# Patient Record
Sex: Female | Born: 1995 | Race: Black or African American | Hispanic: No | Marital: Single | Smoking: Never smoker
Health system: Southern US, Community
[De-identification: ages and names within clinical notes are randomized; demographics above are authoritative.]

---
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
## 2017-07-09 ENCOUNTER — Other Ambulatory Visit: Payer: Self-pay

## 2017-07-09 ENCOUNTER — Encounter (HOSPITAL_BASED_OUTPATIENT_CLINIC_OR_DEPARTMENT_OTHER): Payer: Self-pay

## 2017-07-09 ENCOUNTER — Emergency Department (HOSPITAL_BASED_OUTPATIENT_CLINIC_OR_DEPARTMENT_OTHER)
Admission: EM | Admit: 2017-07-09 | Discharge: 2017-07-09 | Disposition: A | Discharge status: Home / Self Care | Payer: 59 | Attending: Emergency Medicine | Admitting: Emergency Medicine

## 2017-07-09 DIAGNOSIS — J028 Acute pharyngitis due to other specified organisms: Secondary | ICD-10-CM | POA: Insufficient documentation

## 2017-07-09 DIAGNOSIS — B9689 Other specified bacterial agents as the cause of diseases classified elsewhere: Secondary | ICD-10-CM | POA: Insufficient documentation

## 2017-07-09 DIAGNOSIS — J040 Acute laryngitis: Secondary | ICD-10-CM | POA: Diagnosis not present

## 2017-07-09 DIAGNOSIS — R05 Cough: Secondary | ICD-10-CM | POA: Diagnosis present

## 2017-07-09 LAB — RAPID STREP SCREEN (MED CTR MEBANE ONLY): STREPTOCOCCUS, GROUP A SCREEN (DIRECT): NEGATIVE

## 2017-07-09 NOTE — Discharge Instructions (Signed)
You may take over-the-counter medicine for symptomatic relief, such as Tylenol, Motrin, TheraFlu, Alka seltzer , black elderberry, etc. Please limit acetaminophen (Tylenol) to 4000 mg and Ibuprofen (Motrin, Advil, etc.) to 2400 mg for a 24hr period. Please note that other over-the-counter medicine may contain acetaminophen or ibuprofen as a component of their ingredients.   

## 2017-07-09 NOTE — ED Triage Notes (Signed)
C/o flu like sx x 1 week-NAD-steady gait 

## 2017-07-09 NOTE — ED Provider Notes (Signed)
[facility] EMERGENCY DEPARTMENT Provider Note  CSN: [id number] Arrival date & time: 07/09/17 1053  Chief Complaint(s) Cough  HPI Jenny Ho is a 21 y.o. female who presents to the emergency department with 1 week of runny nose, nasal congestion, productive cough, sore throat.  She reports fevers on day 2 and 3 which have resolved.  Reports that her symptoms have persisted even with the fever cessation.  Denies any alleviating or aggravating factors.  Denies any headache, vision changes, nuchal rigidity, chest pain, shortness of breath, nausea, vomiting, diarrhea.  No abdominal pain.  No other associated symptoms.  Reports history of recurrent strep throat.  HPI  Past Medical History History reviewed. No pertinent past medical history. There are no active problems to display for this patient.  Home Medication(s) Prior to Admission medications   Not on File                                                                                                                                    Past Surgical History History reviewed. No pertinent surgical history. Family History No family history on file.  Social History Social History   Tobacco Use  . Smoking status: Never Smoker  . Smokeless tobacco: Never Used  Substance Use Topics  . Alcohol use: Yes    Frequency: Never    Comment: occ  . Drug use: No   Allergies Mushroom extract complex  Review of Systems Review of Systems All other systems are reviewed and are negative for acute change except as noted in the HPI  Physical Exam Vital Signs  I have reviewed the triage vital signs BP (!) 139/103 (BP Location: Right Arm)   Pulse (!) 104   Temp 99 F (37.2 C) (Oral)   Resp 20   Ht 5' (1.524 m)   Wt (!) 156.5 kg (345 lb)   SpO2 99%   BMI 67.38 kg/m   Physical Exam  Constitutional: She is oriented to person, place, and time. She appears well-developed and well-nourished. No distress.  HENT:  Head:  Normocephalic and atraumatic.  Nose: Rhinorrhea present.  Mouth/Throat: No oropharyngeal exudate, posterior oropharyngeal edema or posterior oropharyngeal erythema. Tonsils are 2+ on the right. Tonsils are 2+ on the left. Tonsillar exudate.  Eyes: Conjunctivae and EOM are normal. Pupils are equal, round, and reactive to light. Right eye exhibits no discharge. Left eye exhibits no discharge. No scleral icterus.  Neck: Normal range of motion. Neck supple.  Cardiovascular: Normal rate and regular rhythm. Exam reveals no gallop and no friction rub.  No murmur heard. Pulmonary/Chest: Effort normal and breath sounds normal. No stridor. No respiratory distress. She has no rales.  Abdominal: Soft. She exhibits no distension. There is no tenderness.  Musculoskeletal: She exhibits no edema or tenderness.  Neurological: She is alert and oriented to person, place, and time.  Skin: Skin is warm and dry. No rash noted. She is  not diaphoretic. No erythema.  Psychiatric: She has a normal mood and affect.  Vitals reviewed.   ED Results and Treatments Labs (all labs ordered are listed, but only abnormal results are displayed) Labs Reviewed  RAPID STREP SCREEN (NOT AT [facility])  CULTURE, GROUP A STREP [facility]                                                                                                                         EKG  EKG Interpretation  Date/Time:    Ventricular Rate:    PR Interval:    QRS Duration:   QT Interval:    QTC Calculation:   R Axis:     Text Interpretation:        Radiology No results found. Pertinent labs & imaging results that were available during my care of the patient were reviewed by me and considered in my medical decision making (see chart for details).  Medications Ordered in ED Medications - No data to display                                                                                                                                   Procedures Procedures  (including critical care time)  Medical Decision Making / ED Course I have reviewed the nursing notes for this encounter and the patient's prior records (if available in EHR or on provided paperwork).    21 y.o. female presents with cough, rhinorrhea, sore throat and fever for 7 days. adequate oral hydration. Rest of history as above.  Patient appears well. No signs of toxicity, patient is interactive and playful. No hypoxia, tachypnea or other signs of respiratory distress. No sign of clinical dehydration.  Patient with evidence of pharyngitis. lung exam clear. Rest of exam as above.  Rapid strep negative.  Most consistent with viral upper respiratory infection.   No evidence suggestive of  AOM, PNA, or meningitis.   Chest x-ray not indicated at this time.  Discussed symptomatic treatment with the patient and they will follow closely with their PCP.    Final Clinical Impression(s) / ED Diagnoses Final diagnoses:  Pharyngitis due to other organism  Laryngitis   Disposition: Discharge  Condition: Good  I have discussed the results, Dx and Tx plan with the patient who expressed understanding and agree(s) with the plan. Discharge instructions discussed at great length. The patient was given strict return precautions who  verbalized understanding of the instructions. No further questions at time of discharge.    ED Discharge Orders    None       Follow Up: Primary care provider  Schedule an appointment as soon as possible for a visit  in 5-7 days, If symptoms do not improve or  worsen      This chart was dictated using voice recognition software.  Despite best efforts to proofread,  errors can occur which can change the documentation meaning.   Nira Connardama, Antha Niday Eduardo, MD 07/09/17 1236

## 2017-07-11 LAB — CULTURE, GROUP A STREP (THRC)

## 2017-07-30 ENCOUNTER — Encounter (HOSPITAL_BASED_OUTPATIENT_CLINIC_OR_DEPARTMENT_OTHER): Payer: Self-pay | Admitting: *Deleted

## 2017-07-30 ENCOUNTER — Emergency Department (HOSPITAL_BASED_OUTPATIENT_CLINIC_OR_DEPARTMENT_OTHER)
Admission: EM | Admit: 2017-07-30 | Discharge: 2017-07-30 | Disposition: A | Discharge status: Home / Self Care | Payer: 59 | Attending: Emergency Medicine | Admitting: Emergency Medicine

## 2017-07-30 ENCOUNTER — Emergency Department (HOSPITAL_BASED_OUTPATIENT_CLINIC_OR_DEPARTMENT_OTHER): Payer: 59

## 2017-07-30 ENCOUNTER — Other Ambulatory Visit: Payer: Self-pay

## 2017-07-30 DIAGNOSIS — J40 Bronchitis, not specified as acute or chronic: Secondary | ICD-10-CM | POA: Insufficient documentation

## 2017-07-30 DIAGNOSIS — R05 Cough: Secondary | ICD-10-CM | POA: Diagnosis present

## 2017-07-30 MED ORDER — BENZONATATE 100 MG PO CAPS: 100.0000 mg | ORAL_CAPSULE | Freq: Three times a day (TID) | ORAL | 0 refills | Status: AC

## 2017-07-30 MED ORDER — ALBUTEROL SULFATE HFA 108 (90 BASE) MCG/ACT IN AERS: 1.0000 | INHALATION_SPRAY | Freq: Four times a day (QID) | RESPIRATORY_TRACT | 0 refills | Status: AC | PRN

## 2017-07-30 NOTE — ED Triage Notes (Signed)
Cough with clear to beige sputum.

## 2017-07-30 NOTE — Discharge Instructions (Signed)
Try albuterol as needed for cough.   Try tessalon pearls as needed as well.   Stay hydrated.   See your doctor  Return to ER if you have worse cough, trouble breathing, sore throat, fever >101.

## 2017-07-30 NOTE — ED Provider Notes (Signed)
[facility] EMERGENCY DEPARTMENT Provider Note   CSN: [id number] Arrival date & time: 07/30/17  1340     History   Chief Complaint Chief Complaint  Patient presents with  . Cough    HPI Jenny Ho is a 21 y.o. female who presented with persistent cough.  Patient was seen in the ED about 3 weeks ago for sore throat and was thought to have laryngitis from viral etiology.  Patient states that since then, her though has improved but she has persistent cough.  She has been trying Delsym as well as herbal remedies and other over-the-counter meds with no relief.  Patient states that the cough gets worse at night and sometimes she has trouble breathing due to the cough.  Denies any fevers or chills currently.  She states that her coworker is sick with similar symptoms.  Denies any leg swelling or recent travel.  Patient quit smoking about a year ago.  The history is provided by the patient.    History reviewed. No pertinent past medical history.  There are no active problems to display for this patient.   History reviewed. No pertinent surgical history.  OB History    No data available       Home Medications    Prior to Admission medications   Not on File    Family History No family history on file.  Social History Social History   Tobacco Use  . Smoking status: Never Smoker  . Smokeless tobacco: Never Used  Substance Use Topics  . Alcohol use: Yes    Frequency: Never    Comment: occ  . Drug use: No     Allergies   Mushroom extract complex   Review of Systems Review of Systems  Respiratory: Positive for cough.   All other systems reviewed and are negative.    Physical Exam Updated Vital Signs BP 111/61 (BP Location: Left Arm)   Pulse (!) 108   Temp 98.1 F (36.7 C) (Oral)   Resp 18   Ht 5' (1.524 m)   Wt (!) 158.8 kg (350 lb)   SpO2 98%   BMI 68.35 kg/m   Physical Exam  Constitutional: She is oriented to person, place, and time.  She appears well-developed.  Coughing   HENT:  Head: Normocephalic.  Mouth/Throat: Oropharynx is clear and moist.  Tonsils enlarged, no exudates, not red   Eyes: Conjunctivae and EOM are normal. Pupils are equal, round, and reactive to light.  Neck: Normal range of motion. Neck supple.  Cardiovascular: Normal rate, regular rhythm and normal heart sounds.  Pulmonary/Chest: Effort normal.  Diminished throughout, no obvious wheezing or crackles   Abdominal: Soft. Bowel sounds are normal. She exhibits no distension. There is no tenderness.  Musculoskeletal: Normal range of motion.  Neurological: She is alert and oriented to person, place, and time. No cranial nerve deficit. Coordination normal.  Skin: Skin is warm.  Psychiatric: She has a normal mood and affect. Her behavior is normal.  Nursing note and vitals reviewed.    ED Treatments / Results  Labs (all labs ordered are listed, but only abnormal results are displayed) Labs Reviewed - No data to display  EKG  EKG Interpretation None       Radiology Dg Chest 2 View  Result Date: 07/30/2017 CLINICAL DATA:  Cough and congestion for 1 month. EXAM: CHEST  2 VIEW COMPARISON:  12/24/2010 chest radiograph FINDINGS: The cardiomediastinal silhouette is unremarkable. There is no evidence of focal airspace disease,  pulmonary edema, suspicious pulmonary nodule/mass, pleural effusion, or pneumothorax. No acute bony abnormalities are identified. IMPRESSION: No active cardiopulmonary disease. Electronically Signed   By: Harmon PierJeffrey  Hu M.D.   On: 07/30/2017 14:07    Procedures Procedures (including critical care time)  Medications Ordered in ED Medications - No data to display   Initial Impression / Assessment and Plan / ED Course  I have reviewed the triage vital signs and the nursing notes.  Pertinent labs & imaging results that were available during my care of the patient were reviewed by me and considered in my medical decision making  (see chart for details).    Jenny Ho is a 21 y.o. female here with cough. Cough for 3 weeks. Likely persistent bronchitis. Diminished breath sounds, no crackles or wheezing. CXR clear. Afebrile. Will give albuterol prn, tessalon pearls for cough. No indication for abx currently.    Final Clinical Impressions(s) / ED Diagnoses   Final diagnoses:  None    ED Discharge Orders    None       Charlynne PanderYao, Rokia Bosket Hsienta, MD 07/30/17 [id number]

## 2018-05-30 ENCOUNTER — Ambulatory Visit: Payer: Self-pay | Admitting: Mental Health

## 2018-06-01 ENCOUNTER — Encounter (HOSPITAL_BASED_OUTPATIENT_CLINIC_OR_DEPARTMENT_OTHER): Payer: Self-pay | Admitting: Emergency Medicine

## 2018-06-01 ENCOUNTER — Other Ambulatory Visit: Payer: Self-pay

## 2018-06-01 ENCOUNTER — Emergency Department (HOSPITAL_BASED_OUTPATIENT_CLINIC_OR_DEPARTMENT_OTHER)
Admission: EM | Admit: 2018-06-01 | Discharge: 2018-06-02 | Disposition: A | Discharge status: Home / Self Care | Payer: Managed Care, Other (non HMO) | Attending: Emergency Medicine | Admitting: Emergency Medicine

## 2018-06-01 DIAGNOSIS — Z79899 Other long term (current) drug therapy: Secondary | ICD-10-CM | POA: Diagnosis not present

## 2018-06-01 DIAGNOSIS — R599 Enlarged lymph nodes, unspecified: Secondary | ICD-10-CM

## 2018-06-01 DIAGNOSIS — R590 Localized enlarged lymph nodes: Secondary | ICD-10-CM | POA: Insufficient documentation

## 2018-06-01 DIAGNOSIS — J029 Acute pharyngitis, unspecified: Secondary | ICD-10-CM | POA: Insufficient documentation

## 2018-06-01 LAB — GROUP A STREP BY PCR: Group A Strep by PCR: NOT DETECTED

## 2018-06-01 NOTE — ED Triage Notes (Signed)
Patient states that she has had bilateral neck and jaw swelling and "knots" since wed

## 2018-06-01 NOTE — ED Provider Notes (Signed)
[facility] EMERGENCY DEPARTMENT Provider Note   CSN: [id number] Arrival date & time: 06/01/18  2228     History   Chief Complaint Chief Complaint  Patient presents with  . Neck Pain    HPI Jenny Ho is a 22 y.o. female who presents with bilateral submandibular swollen lymph nodes.  She states that she recently got back to the area from [place] and was next to several sick kids on the plane.  Afterwards she started to develop swollen lymph nodes and a scratchy throat.  Her grandmother insisted that she come to the emergency department tonight because she was concerned about mumps.  The patient states that she is up-to-date on vaccinations.  She denies fever, ear pain, runny nose, severe sore throat, difficulty swallowing.  HPI  History reviewed. No pertinent past medical history.  There are no active problems to display for this patient.   History reviewed. No pertinent surgical history.   OB History   None      Home Medications    Prior to Admission medications   Medication Sig Start Date End Date Taking? Authorizing Provider  albuterol (PROVENTIL HFA;VENTOLIN HFA) 108 (90 Base) MCG/ACT inhaler Inhale 1-2 puffs into the lungs every 6 (six) hours as needed for wheezing or shortness of breath. 07/30/17   Charlynne Pander, MD  benzonatate (TESSALON) 100 MG capsule Take 1 capsule (100 mg total) by mouth every 8 (eight) hours. 07/30/17   Charlynne Pander, MD    Family History History reviewed. No pertinent family history.  Social History Social History   Tobacco Use  . Smoking status: Never Smoker  . Smokeless tobacco: Never Used  Substance Use Topics  . Alcohol use: Yes    Frequency: Never    Comment: occ  . Drug use: No     Allergies   Mushroom extract complex   Review of Systems Review of Systems  Constitutional: Negative for fever.  HENT: Positive for sore throat.        +swollen lymph nodes     Physical Exam Updated Vital  Signs BP (!) 140/102 (BP Location: Right Arm)   Pulse 95   Temp 98.2 F (36.8 C) (Oral)   Resp 18   Ht 5' (1.524 m)   Wt (!) 156.5 kg   SpO2 100%   BMI 67.38 kg/m   Physical Exam  Constitutional: She is oriented to person, place, and time. She appears well-developed and well-nourished. No distress.  Calm, cooperative, obese  HENT:  Head: Normocephalic and atraumatic.  Mouth/Throat: Uvula is midline and mucous membranes are normal. Posterior oropharyngeal edema present. No oropharyngeal exudate. Tonsils are 2+ on the right. Tonsils are 2+ on the left.  Swollen submandibular glands bilaterally  Eyes: Pupils are equal, round, and reactive to light. Conjunctivae are normal. Right eye exhibits no discharge. Left eye exhibits no discharge. No scleral icterus.  Neck: Normal range of motion.  Cardiovascular: Normal rate.  Pulmonary/Chest: Effort normal. No respiratory distress.  Abdominal: She exhibits no distension.  Neurological: She is alert and oriented to person, place, and time.  Skin: Skin is warm and dry.  Psychiatric: She has a normal mood and affect. Her behavior is normal.  Nursing note and vitals reviewed.    ED Treatments / Results  Labs (all labs ordered are listed, but only abnormal results are displayed) Labs Reviewed  GROUP A STREP BY PCR    EKG None  Radiology No results found.  Procedures Procedures (including critical  care time)  Medications Ordered in ED Medications - No data to display   Initial Impression / Assessment and Plan / ED Course  I have reviewed the triage vital signs and the nursing notes.  Pertinent labs & imaging results that were available during my care of the patient were reviewed by me and considered in my medical decision making (see chart for details).  22 year old female presents with bilateral swollen submandibular lymph nodes.  She is mildly hypertensive but otherwise vital signs are normal.  She is well-appearing on exam  and is not in significant discomfort.  She is handling secretions.  Her tonsils appear swollen on exam.  Strep test is negative. Likely viral. Advised supportive care.  Final Clinical Impressions(s) / ED Diagnoses   Final diagnoses:  Swollen lymph nodes    ED Discharge Orders    None       Bethel Born, PA-C 06/02/18 1406    Geoffery Lyons, MD 06/02/18 2304

## 2018-09-13 IMAGING — CR DG CHEST 2V
2 series · 2 of 2 positions shown · non-contrast
Comparison: 12/24/2010 chest radiograph

CLINICAL DATA: Cough and congestion for 1 month.

EXAM:
CHEST  2 VIEW

[w chest pa]
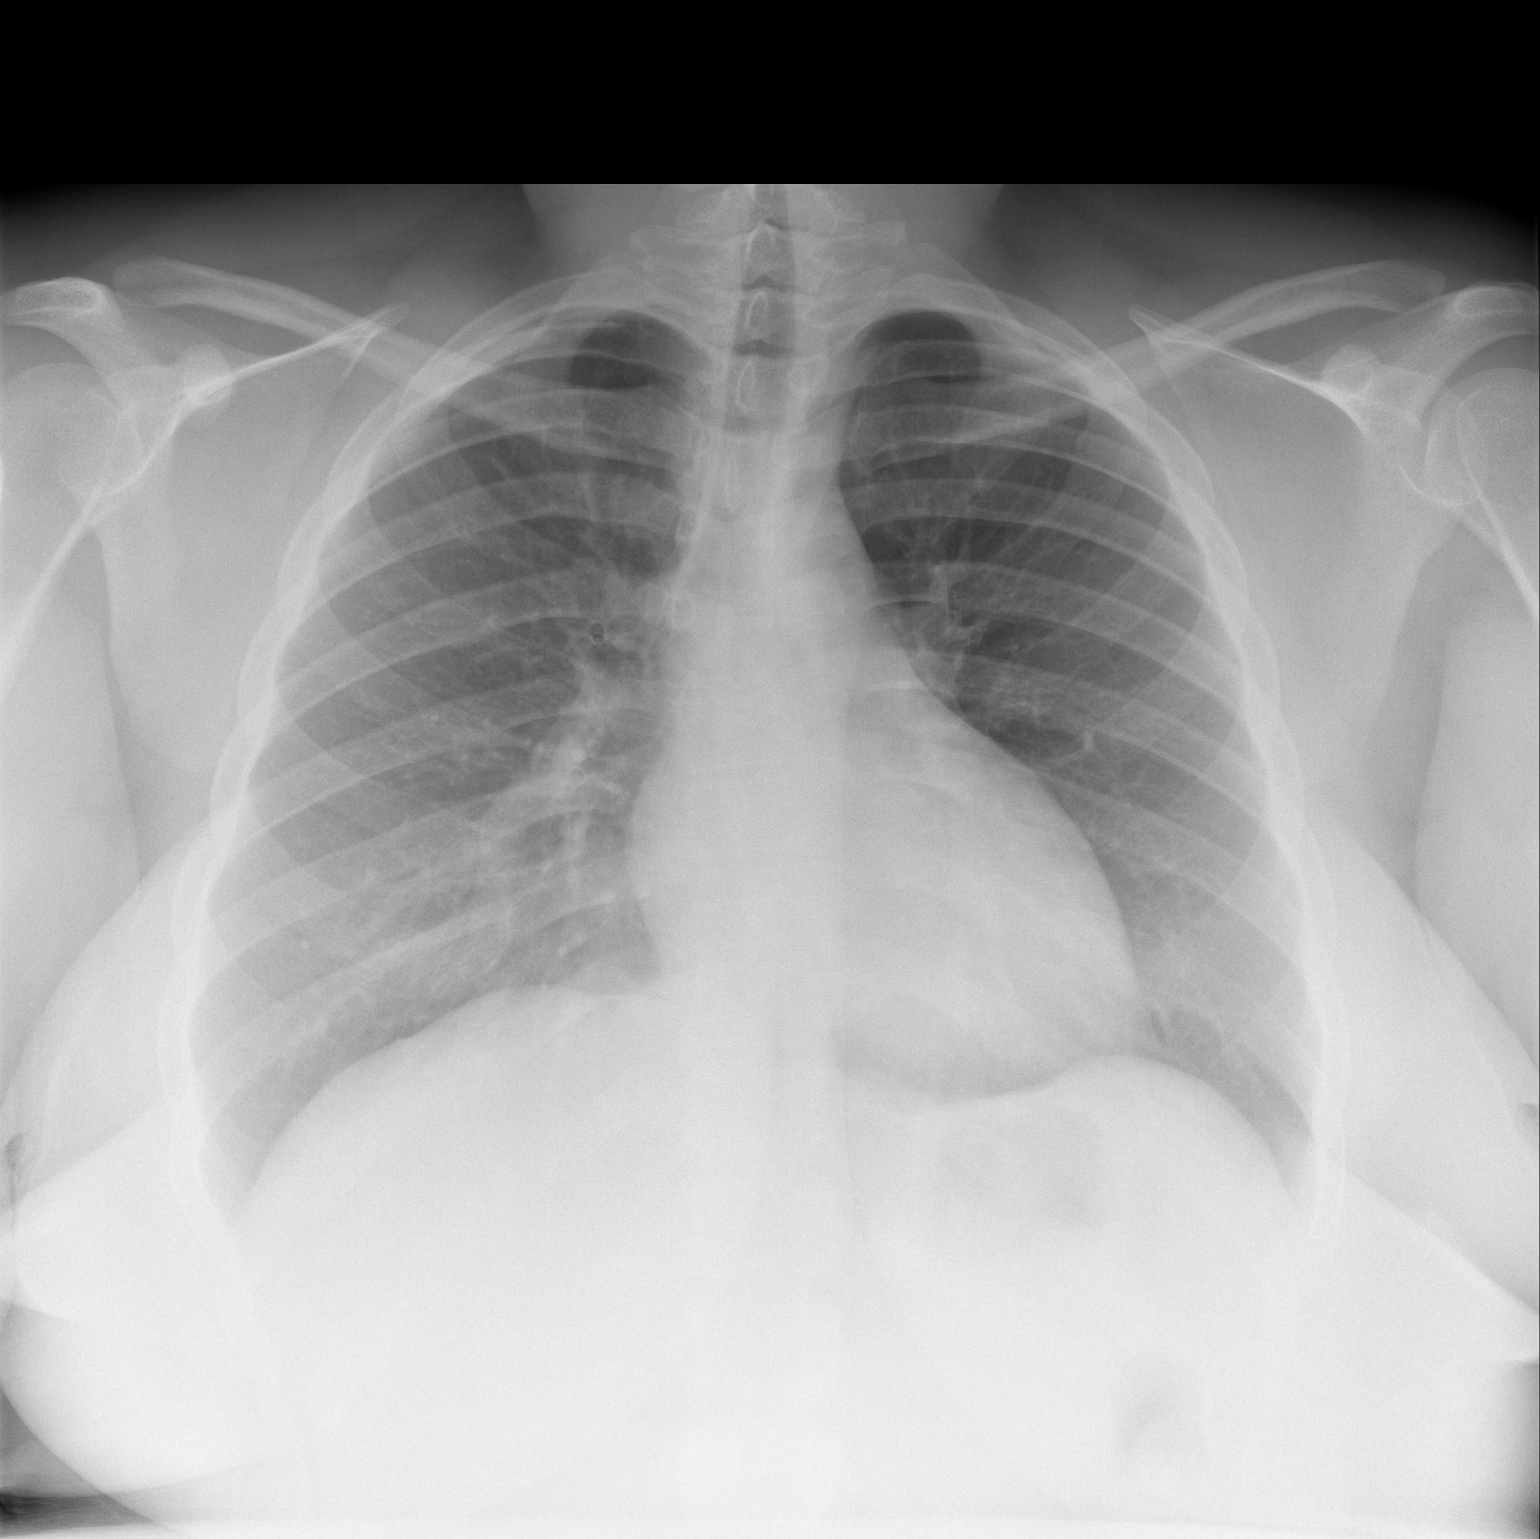

[w chest lat]
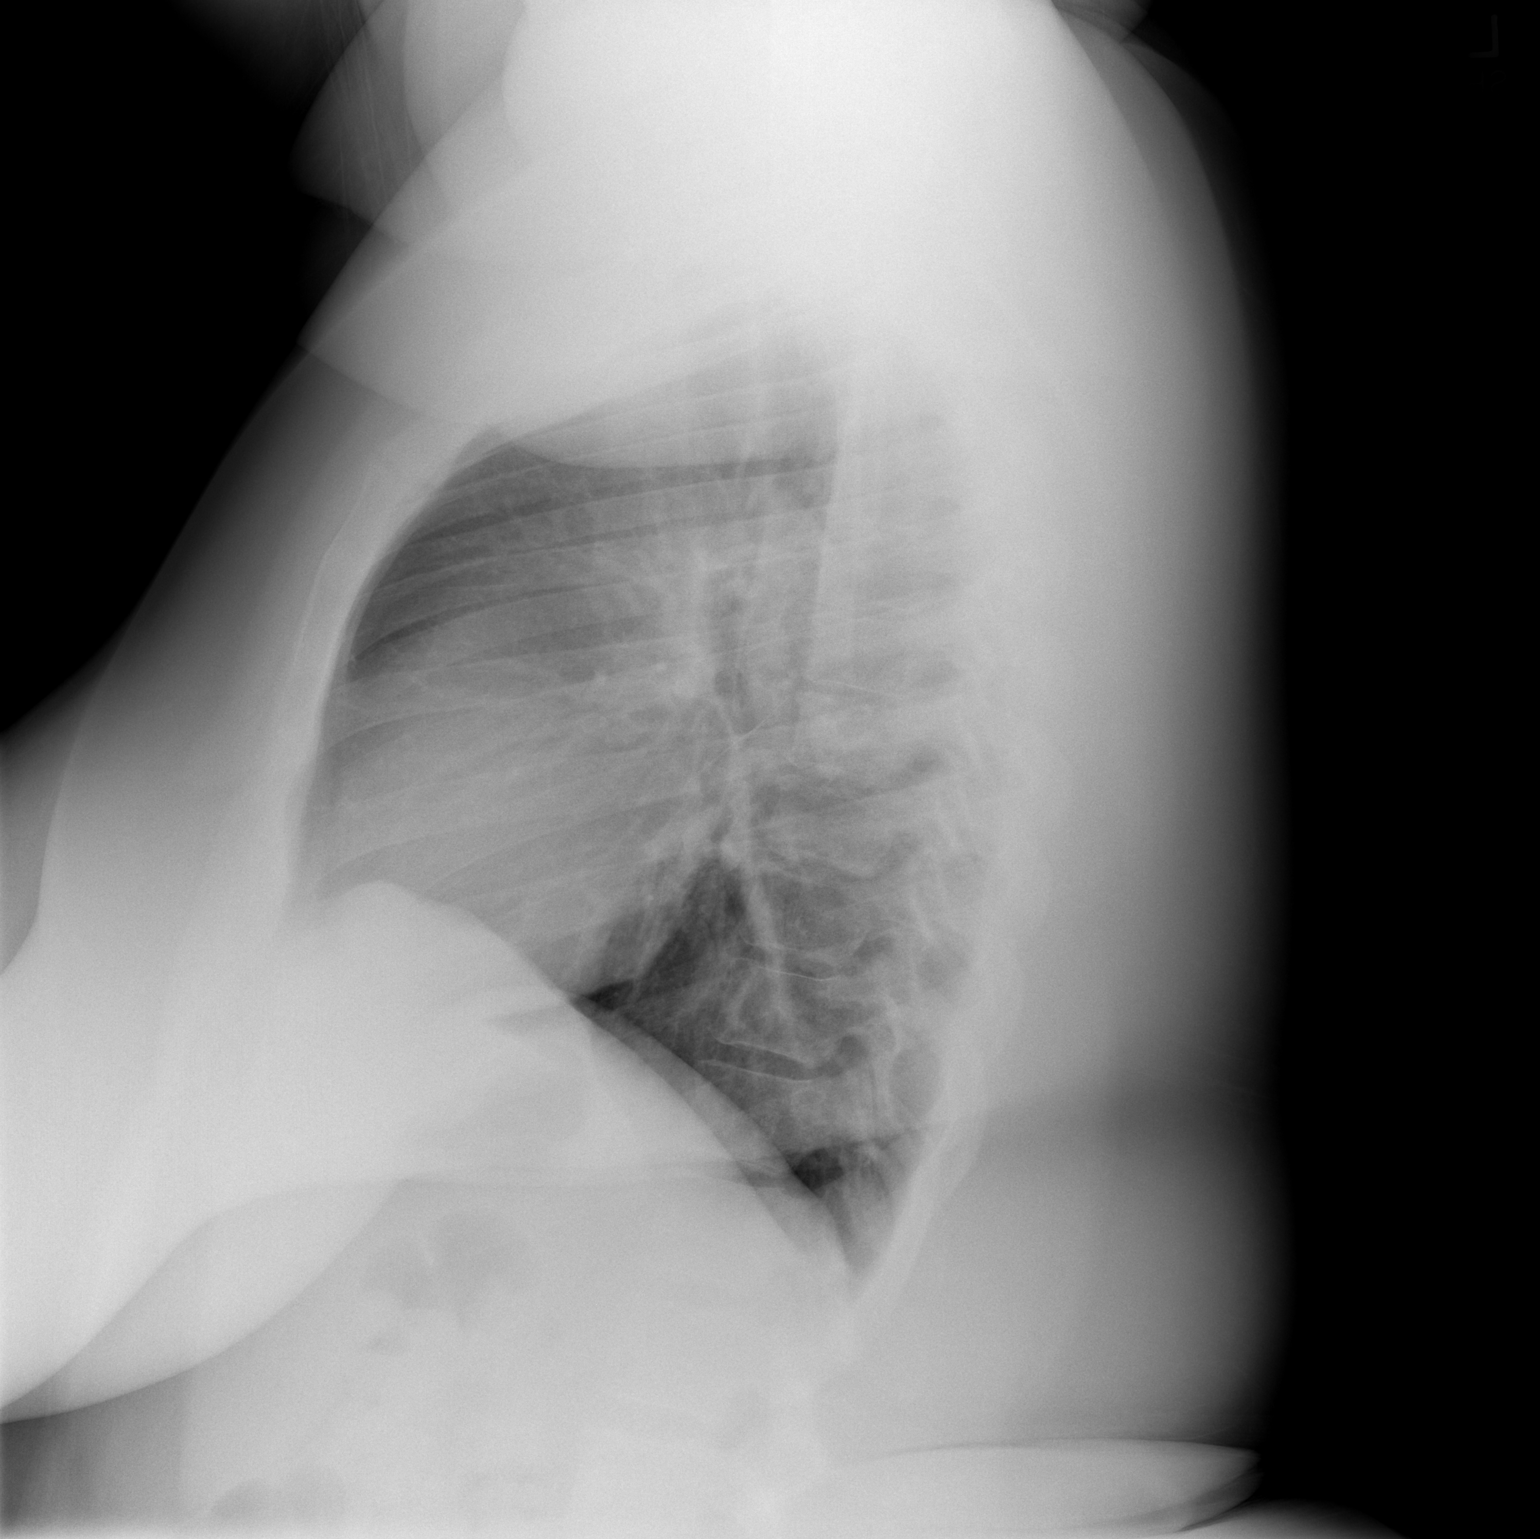

[2 of 2 positions shown; findings below may reference images not displayed]

FINDINGS: The cardiomediastinal silhouette is unremarkable.

There is no evidence of focal airspace disease, pulmonary edema,
suspicious pulmonary nodule/mass, pleural effusion, or pneumothorax.
No acute bony abnormalities are identified.
IMPRESSION: No active cardiopulmonary disease.

## 2018-11-05 ENCOUNTER — Ambulatory Visit: Payer: Self-pay | Admitting: Mental Health
